# Patient Record
Sex: Female | Born: 1995 | Race: Black or African American | Hispanic: No | Marital: Single | Smoking: Never smoker
Health system: Southern US, Community
[De-identification: ages and names within clinical notes are randomized; demographics above are authoritative.]

## PROBLEM LIST (undated history)

## (undated) DIAGNOSIS — K219 Gastro-esophageal reflux disease without esophagitis: Secondary | ICD-10-CM

---

## 2017-09-17 ENCOUNTER — Other Ambulatory Visit: Payer: Self-pay

## 2017-09-17 ENCOUNTER — Emergency Department (HOSPITAL_COMMUNITY)
Admission: EM | Admit: 2017-09-17 | Discharge: 2017-09-17 | Disposition: A | Discharge status: Home / Self Care | Payer: 59 | Attending: Emergency Medicine | Admitting: Emergency Medicine

## 2017-09-17 ENCOUNTER — Encounter (HOSPITAL_COMMUNITY): Payer: Self-pay

## 2017-09-17 DIAGNOSIS — Z79899 Other long term (current) drug therapy: Secondary | ICD-10-CM | POA: Diagnosis not present

## 2017-09-17 DIAGNOSIS — R195 Other fecal abnormalities: Secondary | ICD-10-CM | POA: Diagnosis present

## 2017-09-17 DIAGNOSIS — K649 Unspecified hemorrhoids: Secondary | ICD-10-CM | POA: Insufficient documentation

## 2017-09-17 HISTORY — DX: Gastro-esophageal reflux disease without esophagitis: K21.9

## 2017-09-17 LAB — LIPASE, BLOOD: LIPASE: 34 U/L (ref 11–51)

## 2017-09-17 LAB — COMPREHENSIVE METABOLIC PANEL
ALBUMIN: 4.7 g/dL (ref 3.5–5.0)
ALT: 11 U/L — ABNORMAL LOW (ref 14–54)
ANION GAP: 11 (ref 5–15)
AST: 18 U/L (ref 15–41)
Alkaline Phosphatase: 74 U/L (ref 38–126)
BILIRUBIN TOTAL: 0.9 mg/dL (ref 0.3–1.2)
BUN: 5 mg/dL — ABNORMAL LOW (ref 6–20)
CHLORIDE: 102 mmol/L (ref 101–111)
CO2: 24 mmol/L (ref 22–32)
Calcium: 10.1 mg/dL (ref 8.9–10.3)
Creatinine, Ser: 0.67 mg/dL (ref 0.44–1.00)
GFR calc Af Amer: >60 mL/min (ref >60)
GFR calc non Af Amer: >60 mL/min (ref >60)
GLUCOSE: 89 mg/dL (ref 65–99)
POTASSIUM: 3.6 mmol/L (ref 3.5–5.1)
SODIUM: 137 mmol/L (ref 135–145)
TOTAL PROTEIN: 7.2 g/dL (ref 6.5–8.1)

## 2017-09-17 LAB — CBC
HEMATOCRIT: 41.2 % (ref 36.0–46.0)
HEMOGLOBIN: 13.9 g/dL (ref 12.0–15.0)
MCH: 30.1 pg (ref 26.0–34.0)
MCHC: 33.7 g/dL (ref 30.0–36.0)
MCV: 89.2 fL (ref 78.0–100.0)
Platelets: 263 10*3/uL (ref 150–400)
RBC: 4.62 MIL/uL (ref 3.87–5.11)
RDW: 12.5 % (ref 11.5–15.5)
WBC: 6.1 10*3/uL (ref 4.0–10.5)

## 2017-09-17 LAB — I-STAT BETA HCG BLOOD, ED (MC, WL, AP ONLY)

## 2017-09-17 MED ORDER — HYDROCORTISONE ACETATE 25 MG RE SUPP: 25.0000 mg | Freq: Two times a day (BID) | RECTAL | 0 refills | Status: AC

## 2017-09-17 MED ORDER — DOCUSATE SODIUM 100 MG PO CAPS: 100.0000 mg | ORAL_CAPSULE | Freq: Every day | ORAL | 0 refills | Status: AC

## 2017-09-17 NOTE — ED Notes (Signed)
Pt reports having bright red blood after a bowel movement. Pt reports a history of hemorrhoids.

## 2017-09-17 NOTE — ED Triage Notes (Addendum)
Pt endorses having a large diarrhea episode earlier today that had bright red blood in it. Pt has been lightheaded and has had abd discomfort since. Pt has hx of bleeding hemorrhoids.

## 2017-09-17 NOTE — Discharge Instructions (Signed)
Use the suppository twice daily. Use Colace to help soften your stools.  Make sure you stay well-hydrated. Try to avoid straining, this includes straining for bowel movement, lifting heavy objects, or anything that increases the pressure in your belly. If your symptoms persist, you may follow-up with your primary care doctor or with surgery for removal. Return to the emergency room if you develop fevers, chills, persistent pain, or any new or worsening symptoms.

## 2017-09-18 NOTE — ED Provider Notes (Signed)
[facility] Provider Note   CSN: [id number] Arrival date & time: 09/17/17  1849     History   Chief Complaint Chief Complaint  Patient presents with  . Blood In Stools    HPI Charlotte Willis is a 21 y.o. female presenting with bright red blood per rectum.  Patient states that she had a bowel movement earlier today, in which she saw bright red blood.  She reports that when she saw that, she felt anxious and like she was going to pass out.  Since then, she has had improvement of her symptoms, and no longer feels pre-syncopal.  She denies recurrence of blood in her underwear or stool.  This has happened before, and she was diagnosed with hemorrhoids at that time.  She reports an ache in her low abdomen, which feels like mild menstrual cramps.  This ache has improved throughout the day.  She has not taken anything for this.  It is intermittent, nothing makes it better or worse.  She denies fevers, chills, chest pain, shortness of breath, nausea, vomiting, abdominal pain, or urinary symptoms.  She has no other medical history, does not take medications daily.   HPI  Past Medical History:  Diagnosis Date  . GERD (gastroesophageal reflux disease)     There are no active problems to display for this patient.   History reviewed. No pertinent surgical history.  OB History    No data available       Home Medications    Prior to Admission medications   Medication Sig Start Date End Date Taking? Authorizing Provider  naproxen sodium (ALEVE) 220 MG tablet Take 880 mg daily as needed by mouth (menstrual cramps).   Yes [provider]  omeprazole (PRILOSEC) 20 MG capsule Take 20 mg daily by mouth.   Yes [provider]  docusate sodium (COLACE) 100 MG capsule Take 1 capsule (100 mg total) daily by mouth. 09/17/17   Varun Jourdan, PA-C  hydrocortisone (ANUSOL-HC) 25 MG suppository Place 1 suppository (25 mg total) 2 (two) times  daily rectally. 09/17/17   Deckard Stuber, PA-C    Family History History reviewed. No pertinent family history.  Social History Social History   Tobacco Use  . Smoking status: Never Smoker  Substance Use Topics  . Alcohol use: No    Frequency: Never  . Drug use: No     Allergies   Patient has no known allergies.   Review of Systems Review of Systems  Constitutional: Negative for chills and fever.  Respiratory: Negative for cough, chest tightness and shortness of breath.   Cardiovascular: Negative for chest pain.  Gastrointestinal: Positive for abdominal pain (Improving) and blood in stool (Resolved). Negative for constipation, diarrhea, nausea and vomiting.  Genitourinary: Negative for dysuria, frequency, hematuria and vaginal discharge.  Musculoskeletal: Negative for back pain.  Skin: Negative for wound.  Neurological: Negative for headaches.  Hematological: Does not bruise/bleed easily.  Psychiatric/Behavioral: The patient is nervous/anxious.      Physical Exam Updated Vital Signs BP 108/82   Pulse 70   Temp 98.1 F (36.7 C) (Oral)   Resp 16   Ht 5\' 8"  (1.727 m)   Wt 65.8 kg (145 lb)   LMP 09/06/2017 (Exact Date)   SpO2 100%   BMI 22.05 kg/m   Physical Exam  Constitutional: She is oriented to person, place, and time. She appears well-developed and well-nourished. No distress.  HENT:  Head: Normocephalic and atraumatic.  Eyes: EOM are  normal.  Neck: Normal range of motion.  Cardiovascular: Normal rate, regular rhythm and intact distal pulses.  Pulmonary/Chest: Effort normal and breath sounds normal. No respiratory distress. She has no wheezes. She has no rales. She exhibits no tenderness.  Abdominal: Soft. Bowel sounds are normal. She exhibits no distension and no mass. There is no tenderness. There is no rebound and no guarding.  Genitourinary: Rectal exam shows external hemorrhoid.  Genitourinary Comments: Chaperone present.  External hemorrhoids  without active bleeding noted.  Musculoskeletal: Normal range of motion.  Neurological: She is alert and oriented to person, place, and time.  Skin: Skin is warm and dry.  Psychiatric: She has a normal mood and affect.  Nursing note and vitals reviewed.    ED Treatments / Results  Labs (all labs ordered are listed, but only abnormal results are displayed) Labs Reviewed  COMPREHENSIVE METABOLIC PANEL - Abnormal; Notable for the following components:      Result Value   BUN 5 (*)    ALT 11 (*)    All other components within normal limits  LIPASE, BLOOD  CBC  I-STAT BETA HCG BLOOD, ED ([facility], [facility], [facility] ONLY)    EKG  EKG Interpretation None       Radiology No results found.  Procedures Procedures (including critical care time)  Medications Ordered in ED Medications - No data to display   Initial Impression / Assessment and Plan / ED Course  I have reviewed the triage vital signs and the nursing notes.  Pertinent labs & imaging results that were available during my care of the patient were reviewed by me and considered in my medical decision making (see chart for details).     Patient presenting for evaluation after episode of bright red blood in stool.  Patient with a history of hemorrhoids.  She has not had continuation of blood in stool since.  She has a low abdominal ache, which has improved over the course of today.  When she first of the blood, she felt very anxious and like she is going to pass out, but this has improved.  Physical exam reassuring, abdominal exam showed soft nontender abdomen.  External hemorrhoids noted on exam.  Labs reassuring, no anemia or leukocytosis.  Patient is afebrile not tachycardic.  She is not distressed.  At this time, I do not believe she needs further abdominal imaging.  Discussed findings with patient.  Discussed likely hemorrhoids as cause of symptoms.  Will treat conservatively with steroid suppository and docusate to keep stool soft.   Patient to follow-up with surgery or primary care as needed for further evaluation and management of her hemorrhoids.  At this time, patient appears safe for discharge.  Return precautions given.  Patient states she understands and agrees to plan.   Final Clinical Impressions(s) / ED Diagnoses   Final diagnoses:  Hemorrhoids, unspecified hemorrhoid type    ED Discharge Orders        Ordered    hydrocortisone (ANUSOL-HC) 25 MG suppository  2 times daily     09/17/17 2300    docusate sodium (COLACE) 100 MG capsule  Daily     09/17/17 2300       Rosamaria Donn, PA-C 09/18/17 0134    Gerhard MunchLockwood, Robert, MD 09/18/17 [phone]

## 2018-02-12 ENCOUNTER — Other Ambulatory Visit: Payer: Self-pay

## 2018-02-12 ENCOUNTER — Encounter (HOSPITAL_COMMUNITY): Payer: Self-pay

## 2018-02-12 ENCOUNTER — Emergency Department (HOSPITAL_COMMUNITY)
Admission: EM | Admit: 2018-02-12 | Discharge: 2018-02-13 | Disposition: A | Discharge status: Home / Self Care | Payer: 59 | Attending: Emergency Medicine | Admitting: Emergency Medicine

## 2018-02-12 DIAGNOSIS — R1084 Generalized abdominal pain: Secondary | ICD-10-CM | POA: Diagnosis not present

## 2018-02-12 DIAGNOSIS — R109 Unspecified abdominal pain: Secondary | ICD-10-CM | POA: Diagnosis present

## 2018-02-12 LAB — COMPREHENSIVE METABOLIC PANEL
ALBUMIN: 4.5 g/dL (ref 3.5–5.0)
ALT: 9 U/L — ABNORMAL LOW (ref 14–54)
ANION GAP: 8 (ref 5–15)
AST: 13 U/L — AB (ref 15–41)
Alkaline Phosphatase: 56 U/L (ref 38–126)
BUN: 9 mg/dL (ref 6–20)
CHLORIDE: 105 mmol/L (ref 101–111)
CO2: 28 mmol/L (ref 22–32)
Calcium: 9.6 mg/dL (ref 8.9–10.3)
Creatinine, Ser: 0.81 mg/dL (ref 0.44–1.00)
GFR calc Af Amer: >60 mL/min (ref >60)
GFR calc non Af Amer: >60 mL/min (ref >60)
GLUCOSE: 96 mg/dL (ref 65–99)
POTASSIUM: 3.6 mmol/L (ref 3.5–5.1)
SODIUM: 141 mmol/L (ref 135–145)
Total Bilirubin: 0.6 mg/dL (ref 0.3–1.2)
Total Protein: 7.6 g/dL (ref 6.5–8.1)

## 2018-02-12 LAB — CBC
HEMATOCRIT: 42 % (ref 36.0–46.0)
HEMOGLOBIN: 13.8 g/dL (ref 12.0–15.0)
MCH: 30.2 pg (ref 26.0–34.0)
MCHC: 32.9 g/dL (ref 30.0–36.0)
MCV: 91.9 fL (ref 78.0–100.0)
Platelets: 238 10*3/uL (ref 150–400)
RBC: 4.57 MIL/uL (ref 3.87–5.11)
RDW: 12.3 % (ref 11.5–15.5)
WBC: 4.6 10*3/uL (ref 4.0–10.5)

## 2018-02-12 LAB — URINALYSIS, ROUTINE W REFLEX MICROSCOPIC
Bilirubin Urine: NEGATIVE
Glucose, UA: NEGATIVE mg/dL
Ketones, ur: NEGATIVE mg/dL
Leukocytes, UA: NEGATIVE
Nitrite: NEGATIVE
PROTEIN: NEGATIVE mg/dL
SPECIFIC GRAVITY, URINE: 1.019 (ref 1.005–1.030)
pH: 5 (ref 5.0–8.0)

## 2018-02-12 LAB — I-STAT BETA HCG BLOOD, ED (MC, WL, AP ONLY)

## 2018-02-12 LAB — LIPASE, BLOOD: LIPASE: 40 U/L (ref 11–51)

## 2018-02-12 NOTE — ED Provider Notes (Signed)
[facility]-EMERGENCY DEPT Provider Note   CSN: [id number] Arrival date & time: 02/12/18  2143    History   Chief Complaint Chief Complaint  Patient presents with  . Abdominal Pain    HPI Charlotte Willis is a 22 y.o. female.   22 year old female with a history of reflux presents to the emergency department for abdominal pain.  She has had intermittent abdominal pain which has been waxing and waning for the past 2 weeks.  She reports no associated bloating sensation in her abdomen with most discomfort in her epigastrium.  Symptoms worsened after eating [employer] a few days ago.  She has not had any fevers, vomiting, diarrhea, melena, hematochezia, dysuria.  No history of abdominal surgeries.  She did try taking Imodium prior to arrival without relief.  She also used some Metamucil from a friend.  Patient notes little improvement after taking Naproxen today.  The history is provided by the patient. No language interpreter was used.  Abdominal Pain      Past Medical History:  Diagnosis Date  . GERD (gastroesophageal reflux disease)     There are no active problems to display for this patient.   History reviewed. No pertinent surgical history.   OB History   None      Home Medications    Prior to Admission medications   Medication Sig Start Date End Date Taking? Authorizing Provider  docusate sodium (COLACE) 100 MG capsule Take 1 capsule (100 mg total) daily by mouth. 09/17/17   Caccavale, Sophia, PA-C  hydrocortisone (ANUSOL-HC) 25 MG suppository Place 1 suppository (25 mg total) 2 (two) times daily rectally. 09/17/17   Caccavale, Sophia, PA-C  naproxen sodium (ALEVE) 220 MG tablet Take 880 mg daily as needed by mouth (menstrual cramps).    [provider]  omeprazole (PRILOSEC) 20 MG capsule Take 20 mg daily by mouth.    [provider]  polyethylene glycol powder (GLYCOLAX/MIRALAX) powder Take 17 g by mouth daily. Until daily soft  stools  OTC 02/13/18   Antony Madura, PA-C    Family History History reviewed. No pertinent family history.  Social History Social History   Tobacco Use  . Smoking status: Never Smoker  . Smokeless tobacco: Never Used  Substance Use Topics  . Alcohol use: No    Frequency: Never  . Drug use: No     Allergies   Patient has no known allergies.   Review of Systems Review of Systems  Gastrointestinal: Positive for abdominal pain.  Ten systems reviewed and are negative for acute change, except as noted in the HPI.    Physical Exam Updated Vital Signs BP 111/75 (BP Location: Left Arm)   Pulse 62   Temp 98 F (36.7 C) (Oral)   Resp 16   Ht 5\' 8"  (1.727 m)   Wt 66.2 kg (146 lb)   SpO2 100%   BMI 22.20 kg/m   Physical Exam  Constitutional: She is oriented to person, place, and time. She appears well-developed and well-nourished. No distress.  Nontoxic appearing and in NAD  HENT:  Head: Normocephalic and atraumatic.  Eyes: Conjunctivae and EOM are normal. No scleral icterus.  Neck: Normal range of motion.  Cardiovascular: Normal rate, regular rhythm and intact distal pulses.  Pulmonary/Chest: Effort normal. No stridor. No respiratory distress. She has no wheezes. She has no rales.  Lungs CTAB. Respirations even and unlabored.  Abdominal: Soft. She exhibits no mass. There is tenderness (generalized). There is no guarding.  Soft  without palpable masses or peritoneal signs. No focal TTP at McBurney's point. Negative Murphy's sign.  Musculoskeletal: Normal range of motion.  Neurological: She is alert and oriented to person, place, and time. She exhibits normal muscle tone. Coordination normal.  Skin: Skin is warm and dry. No rash noted. She is not diaphoretic. No erythema. No pallor.  Psychiatric: She has a normal mood and affect. Her behavior is normal.  Nursing note and vitals reviewed.    ED Treatments / Results  Labs (all labs ordered are listed, but only abnormal  results are displayed) Labs Reviewed  COMPREHENSIVE METABOLIC PANEL - Abnormal; Notable for the following components:      Result Value   AST 13 (*)    ALT 9 (*)    All other components within normal limits  URINALYSIS, ROUTINE W REFLEX MICROSCOPIC - Abnormal; Notable for the following components:   APPearance HAZY (*)    Hgb urine dipstick LARGE (*)    Bacteria, UA RARE (*)    Squamous Epithelial / LPF 0-5 (*)    All other components within normal limits  LIPASE, BLOOD  CBC  I-STAT BETA HCG BLOOD, ED ([facility], [facility], [facility] ONLY)    EKG None  Radiology US Abdomen Complete  Result Date: 02/13/2018 CLINICAL DATA:  Abdominal pain for 2 weeks. EXAM: ABDOMEN ULTRASOUND COMPLETE COMPARISON:  None. FINDINGS: Gallbladder: No gallstones or wall thickening visualized. No sonographic Murphy sign noted by sonographer. Common bile duct: Diameter: 2.2 mm, normal Liver: No focal lesion identified. Within normal limits in parenchymal echogenicity. Portal vein is patent on color Doppler imaging with normal direction of blood flow towards the liver. IVC: No abnormality visualized. Pancreas: Visualized portion unremarkable. Spleen: Size and appearance within normal limits. Right Kidney: Length: 9.4 cm. Echogenicity within normal limits. No mass or hydronephrosis visualized. Left Kidney: Length: 11 cm. Echogenicity within normal limits. No mass or hydronephrosis visualized. Abdominal aorta: No aneurysm visualized. Other findings: None. IMPRESSION: Normal examination.  No acute process identified. Electronically Signed   By: Burman Nieves M.D.   On: 02/13/2018 01:10    Procedures Procedures (including critical care time)  Medications Ordered in ED Medications - No data to display   Initial Impression / Assessment and Plan / ED Course  I have reviewed the triage vital signs and the nursing notes.  Pertinent labs & imaging results that were available during my care of the patient were reviewed by me and  considered in my medical decision making (see chart for details).     21 year old female presents to the emergency department for generalized abdominal discomfort as well as bloating.  She has not experienced vomiting, diarrhea, urinary symptoms.  She has no focal abdominal tenderness on exam.  No peritoneal signs.  Laboratory workup reassuring.  An ultrasound was obtained which shows no evidence of gallstones or other acute process in the abdomen.  Exam is stable on repeat assessment.  Question whether symptoms may be secondary to constipation.  Low suspicion for emergent cause of symptoms.  Will plan for increasing daily fiber and starting MiraLAX.  Patient advised to follow-up with a primary care doctor regarding her visit today.  Return precautions discussed and provided.  Patient discharged in stable condition with no unaddressed concerns.   Final Clinical Impressions(s) / ED Diagnoses   Final diagnoses:  Abdominal pain, unspecified abdominal location    ED Discharge Orders        Ordered    polyethylene glycol powder (GLYCOLAX/MIRALAX) powder  Daily  02/13/18 0137       Antony MaduraHumes, Carnell Beavers, PA-C 02/13/18 0232    Palumbo, April, MD 02/13/18 [id number]

## 2018-02-12 NOTE — ED Triage Notes (Signed)
States abdominal pain for 2 weeks and pain in epigastric area no fever noted no dysuria voiced.

## 2018-02-13 ENCOUNTER — Emergency Department (HOSPITAL_COMMUNITY): Payer: 59

## 2018-02-13 MED ORDER — POLYETHYLENE GLYCOL 3350 17 GM/SCOOP PO POWD: 17.0000 g | Freq: Every day | ORAL | 0 refills | Status: AC

## 2018-02-13 NOTE — Discharge Instructions (Signed)
Your workup in the emergency department today was reassuring and did not reveal a concerning cause of your symptoms.  We recommend use of daily MiraLAX.  Continue your daily omeprazole.  Follow-up with a primary care doctor to ensure resolution of symptoms.

## 2018-03-05 ENCOUNTER — Encounter (HOSPITAL_COMMUNITY): Payer: Self-pay | Admitting: Emergency Medicine

## 2018-03-05 ENCOUNTER — Ambulatory Visit (HOSPITAL_COMMUNITY)
Admission: EM | Admit: 2018-03-05 | Discharge: 2018-03-05 | Disposition: A | Discharge status: Home / Self Care | Payer: 59 | Attending: Family Medicine | Admitting: Family Medicine

## 2018-03-05 DIAGNOSIS — R1010 Upper abdominal pain, unspecified: Secondary | ICD-10-CM

## 2018-03-05 DIAGNOSIS — K529 Noninfective gastroenteritis and colitis, unspecified: Secondary | ICD-10-CM

## 2018-03-05 MED ORDER — RANITIDINE HCL 300 MG PO TABS: 300.0000 mg | ORAL_TABLET | Freq: Every day | ORAL | 0 refills | Status: AC

## 2018-03-05 MED ORDER — ONDANSETRON 4 MG PO TBDP
ORAL_TABLET | ORAL | Status: AC
  Filled 2018-03-05: qty 1

## 2018-03-05 MED ORDER — ONDANSETRON 8 MG PO TBDP: 8.0000 mg | ORAL_TABLET | Freq: Three times a day (TID) | ORAL | 0 refills | Status: AC | PRN

## 2018-03-05 MED ORDER — ONDANSETRON 4 MG PO TBDP
4.0000 mg | ORAL_TABLET | Freq: Once | ORAL | Status: AC
  Administered 2018-03-05: 4 mg via ORAL

## 2018-03-05 NOTE — ED Triage Notes (Signed)
Pt sts upper abd pain with hx of GERD

## 2018-03-05 NOTE — Discharge Instructions (Addendum)
Clear liquids or broth for the next 24 hours.  Avoid junk food in the future.  Your digestive tract is rebelling from poor food selection.

## 2018-03-05 NOTE — ED Provider Notes (Signed)
[facility]   [id number] 03/05/18 Arrival Time: 1913   SUBJECTIVE:  Charlotte Willis is a 22 y.o. female who presents to the urgent care with complaint of upper abd pain with hx of GERD.  Patient has had bloating for three days.  No vomiting.  Some nausea.  Passing gas.  No diarrhea.   Past Medical History:  Diagnosis Date  . GERD (gastroesophageal reflux disease)    History reviewed. No pertinent family history. Social History   Socioeconomic History  . Marital status: Single    Spouse name: Not on file  . Number of children: Not on file  . Years of education: Not on file  . Highest education level: Not on file  Occupational History  . Not on file  Social Needs  . Financial resource strain: Not on file  . Food insecurity:    Worry: Not on file    Inability: Not on file  . Transportation needs:    Medical: Not on file    Non-medical: Not on file  Tobacco Use  . Smoking status: Never Smoker  . Smokeless tobacco: Never Used  Substance and Sexual Activity  . Alcohol use: No    Frequency: Never  . Drug use: No  . Sexual activity: Not on file  Lifestyle  . Physical activity:    Days per week: Not on file    Minutes per session: Not on file  . Stress: Not on file  Relationships  . Social connections:    Talks on phone: Not on file    Gets together: Not on file    Attends religious service: Not on file    Active member of club or organization: Not on file    Attends meetings of clubs or organizations: Not on file    Relationship status: Not on file  . Intimate partner violence:    Fear of current or ex partner: Not on file    Emotionally abused: Not on file    Physically abused: Not on file    Forced sexual activity: Not on file  Other Topics Concern  . Not on file  Social History Narrative  . Not on file   No outpatient medications have been marked as taking for the 03/05/18 encounter [facility] Encounter).   No Known Allergies    ROS: As per  HPI, remainder of ROS negative.   OBJECTIVE:   Vitals:   03/05/18 1945  BP: 121/82  Pulse: 85  Resp: 18  Temp: 98.2 F (36.8 C)  TempSrc: Oral  SpO2: 95%     General appearance: alert; no distress Eyes: PERRL; EOMI; conjunctiva normal HENT: normocephalic; atraumatic; oral mucosa normal Neck: supple Lungs: clear to auscultation bilaterally Heart: regular rate and rhythm Abdomen: soft, non-tender; bowel sounds normal; no masses or organomegaly; no guarding or rebound tenderness Back: no CVA tenderness Extremities: no cyanosis or edema; symmetrical with no gross deformities Skin: warm and dry Neurologic: normal gait; grossly normal Psychological: alert and cooperative; normal mood and affect      Labs:  Results for orders placed or performed during the hospital encounter of 02/12/18  Lipase, blood  Result Value Ref Range   Lipase 40 11 - 51 U/L  Comprehensive metabolic panel  Result Value Ref Range   Sodium 141 135 - 145 mmol/L   Potassium 3.6 3.5 - 5.1 mmol/L   Chloride 105 101 - 111 mmol/L   CO2 28 22 - 32 mmol/L   Glucose, Bld 96 65 - 99  mg/dL   BUN 9 6 - 20 mg/dL   Creatinine, Ser 0.86 0.44 - 1.00 mg/dL   Calcium 9.6 8.9 - 57.8 mg/dL   Total Protein 7.6 6.5 - 8.1 g/dL   Albumin 4.5 3.5 - 5.0 g/dL   AST 13 (L) 15 - 41 U/L   ALT 9 (L) 14 - 54 U/L   Alkaline Phosphatase 56 38 - 126 U/L   Total Bilirubin 0.6 0.3 - 1.2 mg/dL   GFR calc non Af Amer >60 >60 mL/min   GFR calc Af Amer >60 >60 mL/min   Anion gap 8 5 - 15  CBC  Result Value Ref Range   WBC 4.6 4.0 - 10.5 K/uL   RBC 4.57 3.87 - 5.11 MIL/uL   Hemoglobin 13.8 12.0 - 15.0 g/dL   HCT 46.9 62.9 - 52.8 %   MCV 91.9 78.0 - 100.0 fL   MCH 30.2 26.0 - 34.0 pg   MCHC 32.9 30.0 - 36.0 g/dL   RDW 41.3 24.4 - 01.0 %   Platelets 238 150 - 400 K/uL  Urinalysis, Routine w reflex microscopic  Result Value Ref Range   Color, Urine YELLOW YELLOW   APPearance HAZY (A) CLEAR   Specific Gravity, Urine 1.019  1.005 - 1.030   pH 5.0 5.0 - 8.0   Glucose, UA NEGATIVE NEGATIVE mg/dL   Hgb urine dipstick LARGE (A) NEGATIVE   Bilirubin Urine NEGATIVE NEGATIVE   Ketones, ur NEGATIVE NEGATIVE mg/dL   Protein, ur NEGATIVE NEGATIVE mg/dL   Nitrite NEGATIVE NEGATIVE   Leukocytes, UA NEGATIVE NEGATIVE   RBC / HPF 0-5 0 - 5 RBC/hpf   WBC, UA 6-30 0 - 5 WBC/hpf   Bacteria, UA RARE (A) NONE SEEN   Squamous Epithelial / LPF 0-5 (A) NONE SEEN   Mucus PRESENT   I-Stat beta hCG blood, ED  Result Value Ref Range   I-stat hCG, quantitative <5.0 <5 mIU/mL   Comment 3            Labs Reviewed - No data to display  No results found.     ASSESSMENT & PLAN:  1. Noninfectious gastroenteritis, unspecified type   Clear liquids or broth for the next 24 hours.  Avoid junk food in the future.  Your digestive tract is rebelling from poor food selection.  Meds ordered this encounter  Medications  . ondansetron (ZOFRAN-ODT) disintegrating tablet 4 mg  . ranitidine (ZANTAC) 300 MG tablet    Sig: Take 1 tablet (300 mg total) by mouth at bedtime.    Dispense:  10 tablet    Refill:  0  . ondansetron (ZOFRAN-ODT) 8 MG disintegrating tablet    Sig: Take 1 tablet (8 mg total) by mouth every 8 (eight) hours as needed for nausea.    Dispense:  12 tablet    Refill:  0    Reviewed expectations re: course of current medical issues. Questions answered. Outlined signs and symptoms indicating need for more acute intervention. Patient verbalized understanding. After Visit Summary given.    Procedures:      Elvina Sidle, MD 03/05/18 2001

## 2018-09-21 ENCOUNTER — Encounter (HOSPITAL_COMMUNITY): Payer: Self-pay

## 2018-09-21 ENCOUNTER — Other Ambulatory Visit: Payer: Self-pay

## 2018-09-21 ENCOUNTER — Emergency Department (HOSPITAL_COMMUNITY)
Admission: EM | Admit: 2018-09-21 | Discharge: 2018-09-21 | Disposition: A | Discharge status: Home / Self Care | Payer: Managed Care, Other (non HMO) | Attending: Emergency Medicine | Admitting: Emergency Medicine

## 2018-09-21 ENCOUNTER — Emergency Department (HOSPITAL_COMMUNITY): Payer: Managed Care, Other (non HMO)

## 2018-09-21 DIAGNOSIS — Z79899 Other long term (current) drug therapy: Secondary | ICD-10-CM | POA: Insufficient documentation

## 2018-09-21 DIAGNOSIS — R05 Cough: Secondary | ICD-10-CM | POA: Insufficient documentation

## 2018-09-21 DIAGNOSIS — R0981 Nasal congestion: Secondary | ICD-10-CM | POA: Insufficient documentation

## 2018-09-21 DIAGNOSIS — R059 Cough, unspecified: Secondary | ICD-10-CM

## 2018-09-21 MED ORDER — BENZONATATE 100 MG PO CAPS: 100.0000 mg | ORAL_CAPSULE | Freq: Three times a day (TID) | ORAL | 0 refills | Status: AC

## 2018-09-21 MED ORDER — PSEUDOEPHEDRINE-GUAIFENESIN ER 60-600 MG PO TB12: 1.0000 | ORAL_TABLET | Freq: Two times a day (BID) | ORAL | 0 refills | Status: AC

## 2018-09-21 NOTE — ED Triage Notes (Signed)
Pt reports cough since last Sunday. She reports hemoptysis today.

## 2018-09-21 NOTE — ED Provider Notes (Signed)
[facility]-EMERGENCY DEPT Provider Note   CSN: [id number] Arrival date & time: 09/21/18  0240     History   Chief Complaint Chief Complaint  Patient presents with  . Cough    HPI Charlotte Willis is a 22 y.o. female.  The history is provided by the patient and medical records.  Cough      22 y.o. F with hx of GERD, presenting to the ED for cough.  States she has had a cough for about 3 days now.  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  Reports yesterday at practice she developed a nosebleed and then began coughing up blood.  Reports she does feel some "congestion" in her chest and in her nose.  She denies any fevers or chills.  No nausea or vomiting.  She denies any chest pain or shortness of breath.  Past Medical History:  Diagnosis Date  . GERD (gastroesophageal reflux disease)     There are no active problems to display for this patient.   History reviewed. No pertinent surgical history.   OB History   None      Home Medications    Prior to Admission medications   Medication Sig Start Date End Date Taking? Authorizing Provider  docusate sodium (COLACE) 100 MG capsule Take 1 capsule (100 mg total) daily by mouth. 09/17/17   Caccavale, Sophia, PA-C  hydrocortisone (ANUSOL-HC) 25 MG suppository Place 1 suppository (25 mg total) 2 (two) times daily rectally. 09/17/17   Caccavale, Sophia, PA-C  naproxen sodium (ALEVE) 220 MG tablet Take 880 mg daily as needed by mouth (menstrual cramps).    [provider]  ondansetron (ZOFRAN-ODT) 8 MG disintegrating tablet Take 1 tablet (8 mg total) by mouth every 8 (eight) hours as needed for nausea. 03/05/18   Elvina Sidle, MD  polyethylene glycol powder (GLYCOLAX/MIRALAX) powder Take 17 g by mouth daily. Until daily soft stools  OTC 02/13/18   Antony Madura, PA-C  ranitidine (ZANTAC) 300 MG tablet Take 1 tablet (300 mg total) by mouth at bedtime. 03/05/18    Elvina Sidle, MD    Family History History reviewed. No pertinent family history.  Social History Social History   Tobacco Use  . Smoking status: Never Smoker  . Smokeless tobacco: Never Used  Substance Use Topics  . Alcohol use: No    Frequency: Never  . Drug use: No     Allergies   Patient has no known allergies.   Review of Systems Review of Systems  Respiratory: Positive for cough.   All other systems reviewed and are negative.    Physical Exam Updated Vital Signs BP 111/83 (BP Location: Left Arm)   Pulse 67   Temp 98.1 F (36.7 C) (Oral)   Resp 17   SpO2 98%   Physical Exam  Constitutional: She is oriented to person, place, and time. She appears well-developed and well-nourished.  HENT:  Head: Normocephalic and atraumatic.  Right Ear: Tympanic membrane and ear canal normal.  Left Ear: Tympanic membrane and ear canal normal.  Nose: Mucosal edema present.  Mouth/Throat: Uvula is midline, oropharynx is clear and moist and mucous membranes are normal.  Nasal congestion, polyp left nostril, no active bleeding  Eyes: Pupils are equal, round, and reactive to light. Conjunctivae and EOM are normal.  Neck: Normal range of motion.  Cardiovascular: Normal rate, regular rhythm and normal heart sounds.  Pulmonary/Chest: Effort normal and breath sounds normal. No stridor. No  respiratory distress. She has no wheezes. She has no rhonchi.  Abdominal: Soft. Bowel sounds are normal. There is no tenderness. There is no rebound.  Musculoskeletal: Normal range of motion.  Neurological: She is alert and oriented to person, place, and time.  Skin: Skin is warm and dry.  Psychiatric: She has a normal mood and affect.  Nursing note and vitals reviewed.    ED Treatments / Results  Labs (all labs ordered are listed, but only abnormal results are displayed) Labs Reviewed - No data to display  EKG None  Radiology No results found.  Procedures Procedures (including  critical care time)  Medications Ordered in ED Medications - No data to display   Initial Impression / Assessment and Plan / ED Course  I have reviewed the triage vital signs and the nursing notes.  Pertinent labs & imaging results that were available during my care of the patient were reviewed by me and considered in my medical decision making (see chart for details).  22 y.o. F here with cough for 3 days.  Reports hemoptysis yesterday in the context of nosebleed.  She is afebrile, non-toxic in appearance here.  Has some nasal congestion without active epistaxis.  Lungs are clear without wheezes or rhonchi.  She has no tachycardia or hypoxia, no complaint of chest pain or SOB.  Doubt PE. CXR obtained and is negative.  Discussed symptomatic care with decongestants and cough medication.  School note provided.  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  She will return here for any new/acute changes.  Final Clinical Impressions(s) / ED Diagnoses   Final diagnoses:  Cough    ED Discharge Orders         Ordered    benzonatate (TESSALON) 100 MG capsule  Every 8 hours     09/21/18 0400    pseudoephedrine-guaifenesin (MUCINEX D) 60-600 MG 12 hr tablet  Every 12 hours     09/21/18 0400           Garlon HatchetSanders, Ellington Cornia M, PA-C 09/21/18 0406    Gilda CreasePollina, Christopher J, MD 09/21/18 [phone]

## 2018-09-21 NOTE — Discharge Instructions (Signed)
Take the prescribed medication as directed.  Make sure to rest and drink fluids. Follow-up with your primary care doctor or student health center. Return to the ED for new or worsening symptoms.

## 2018-12-26 ENCOUNTER — Other Ambulatory Visit: Payer: Self-pay

## 2018-12-26 ENCOUNTER — Ambulatory Visit (HOSPITAL_COMMUNITY)
Admission: EM | Admit: 2018-12-26 | Discharge: 2018-12-26 | Disposition: A | Discharge status: Home / Self Care | Payer: Managed Care, Other (non HMO) | Attending: Internal Medicine | Admitting: Internal Medicine

## 2018-12-26 ENCOUNTER — Encounter (HOSPITAL_COMMUNITY): Payer: Self-pay | Admitting: Emergency Medicine

## 2018-12-26 DIAGNOSIS — G4452 New daily persistent headache (NDPH): Secondary | ICD-10-CM | POA: Diagnosis not present

## 2018-12-26 DIAGNOSIS — M542 Cervicalgia: Secondary | ICD-10-CM

## 2018-12-26 MED ORDER — SUMATRIPTAN SUCCINATE 100 MG PO TABS: 100.0000 mg | ORAL_TABLET | ORAL | 0 refills | Status: AC | PRN

## 2018-12-26 MED ORDER — SUMATRIPTAN SUCCINATE 6 MG/0.5ML ~~LOC~~ SOLN
6.0000 mg | Freq: Once | SUBCUTANEOUS | Status: AC
  Administered 2018-12-26: 6 mg via SUBCUTANEOUS

## 2018-12-26 MED ORDER — SUMATRIPTAN SUCCINATE 6 MG/0.5ML ~~LOC~~ SOLN
SUBCUTANEOUS | Status: AC
  Filled 2018-12-26: qty 0.5

## 2018-12-26 NOTE — Discharge Instructions (Signed)
A dose of sumatriptan (a migraine medicine) helped your headache in clinic today.  Prescription for sumatriptan tablets was sent to the pharmacy; can take up to 2 tablets in 24 hours for bad headache.  Ice for 5-10 minutes several times daily can be helpful in decreasing headache/neck pain.  Physical therapy for core strengthening can also help with relieving daily headaches/neck pain.  Followup with your primary care provider to discuss whether a daily preventive medicine might be a good strategy if your headaches persist.
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
## 2018-12-26 NOTE — ED Provider Notes (Signed)
[facility]    CSN: [id number] Arrival date & time: 12/26/18  1640     History   Chief Complaint Chief Complaint  Patient presents with  . Headache  . Neck Pain    HPI Charlotte Willis is a 23 y.o. female.   She presents today with neck pain, right mid/posterior neck, for the last month or so.  She has also had some trouble with right-sided frontal headache during that time.  She was seen recently at student health, and prescribed Aleve and Flexeril, which have not been that helpful.  She tends to notice neck pain later in the day or when she is laying down.  She does sometimes spend time on her phone while she is in bed.  Works at [employer], notices pain in her neck on the job. No weakness or clumsiness of the arms or legs, was able to walk into the urgent care independently.  No change in vision.  No vomiting but does have sound and light sensitivity at times. She has had episodic headaches in the past. Family history notable for headaches in the mother.    HPI  Past Medical History:  Diagnosis Date  . GERD (gastroesophageal reflux disease)     History reviewed. No pertinent surgical history.     Home Medications    Prior to Admission medications   Medication Sig Start Date End Date Taking? Authorizing Provider  cyclobenzaprine (FLEXERIL) 10 MG tablet Take 10 mg by mouth 3 (three) times daily as needed for muscle spasms.   Yes [provider]  naproxen sodium (ALEVE) 220 MG tablet Take 220 mg by mouth.   Yes [provider]  benzonatate (TESSALON) 100 MG capsule Take 1 capsule (100 mg total) by mouth every 8 (eight) hours. 09/21/18   Garlon Hatchet, PA-C  docusate sodium (COLACE) 100 MG capsule Take 1 capsule (100 mg total) daily by mouth. 09/17/17   Caccavale, Sophia, PA-C  hydrocortisone (ANUSOL-HC) 25 MG suppository Place 1 suppository (25 mg total) 2 (two) times daily rectally. 09/17/17   Caccavale, Sophia, PA-C  naproxen sodium (ALEVE)  220 MG tablet Take 880 mg daily as needed by mouth (menstrual cramps).    [provider]  ondansetron (ZOFRAN-ODT) 8 MG disintegrating tablet Take 1 tablet (8 mg total) by mouth every 8 (eight) hours as needed for nausea. 03/05/18   Elvina Sidle, MD  polyethylene glycol powder (GLYCOLAX/MIRALAX) powder Take 17 g by mouth daily. Until daily soft stools  OTC 02/13/18   Antony Madura, PA-C  pseudoephedrine-guaifenesin [facility] D) 60-600 MG 12 hr tablet Take 1 tablet by mouth every 12 (twelve) hours. 09/21/18   Garlon Hatchet, PA-C  ranitidine (ZANTAC) 300 MG tablet Take 1 tablet (300 mg total) by mouth at bedtime. 03/05/18   Elvina Sidle, MD  SUMAtriptan (IMITREX) 100 MG tablet Take 1 tablet (100 mg total) by mouth every 2 (two) hours as needed for migraine. Maximum 2 tablets for headache, in 24 hours 12/26/18   Isa Rankin, MD    Family History Family History  Problem Relation Age of Onset  . Hypertension Father     Social History Social History   Tobacco Use  . Smoking status: Never Smoker  . Smokeless tobacco: Never Used  Substance Use Topics  . Alcohol use: No    Frequency: Never  . Drug use: No     Allergies   Patient has no known allergies.   Review of Systems Review of Systems  All  other systems reviewed and are negative.    Physical Exam Triage Vital Signs ED Triage Vitals  Enc Vitals Group     BP 12/26/18 1831 106/77     Pulse Rate 12/26/18 1831 83     Resp 12/26/18 1831 18     Temp 12/26/18 1831 98.1 F (36.7 C)     Temp Source 12/26/18 1831 Temporal     SpO2 12/26/18 1831 100 %     Weight --      Height --      Pain Score 12/26/18 1827 8     Pain Loc --    Updated Vital Signs BP 106/77 (BP Location: Right Arm)   Pulse 83   Temp 98.1 F (36.7 C) (Temporal)   Resp 18   LMP 12/22/2018   SpO2 100%  Physical Exam Vitals signs and nursing note reviewed.  Constitutional:      General: She is not in acute distress.    Comments:  Alert, nicely groomed  HENT:     Head: Atraumatic.     Comments: Bilateral TMs are translucent, no erythema No significant nasal congestion bilaterally Throat is pink Eyes:     Comments: Conjugate gaze, no eye redness/drainage  Neck:     Musculoskeletal: Neck supple.  Cardiovascular:     Rate and Rhythm: Normal rate.  Pulmonary:     Effort: No respiratory distress.  Abdominal:     General: There is no distension.  Musculoskeletal: Normal range of motion.     Comments: No leg swelling  Skin:    General: Skin is warm and dry.     Comments: No cyanosis  Neurological:     Mental Status: She is alert and oriented to person, place, and time.     Comments: Face is symmetric, speech is clear/coherent Patient was able to walk into the urgent care independently and climb onto the exam table without difficulty      UC Treatments / Results   Procedures Procedures (including critical care time)  Medications Ordered in UC Medications  SUMAtriptan (IMITREX) injection 6 mg (6 mg Subcutaneous Given 12/26/18 1905)   Patient reports substantial improvement in pain after sumatriptan injection.  Final Clinical Impressions(s) / UC Diagnoses   Final diagnoses:  New persistent daily headache  Neck pain     Discharge Instructions     A dose of sumatriptan (a migraine medicine) helped your headache in clinic today.  Prescription for sumatriptan tablets was sent to the pharmacy; can take up to 2 tablets in 24 hours for bad headache.  Ice for 5-10 minutes several times daily can be helpful in decreasing headache/neck pain.  Physical therapy for core strengthening can also help with relieving daily headaches/neck pain.  Followup with your primary care provider to discuss whether a daily preventive medicine might be a good strategy if your headaches persist.      ED Prescriptions    Medication Sig Dispense Auth. Provider   SUMAtriptan (IMITREX) 100 MG tablet Take 1 tablet (100 mg total) by  mouth every 2 (two) hours as needed for migraine. Maximum 2 tablets for headache, in 24 hours 10 tablet Isa Rankin, MD        Isa Rankin, MD 12/31/18 [phone]

## 2019-04-07 IMAGING — CR DG CHEST 2V
2 series · 2 of 2 positions shown · non-contrast
Comparison: None.

CLINICAL DATA: Acute onset of productive cough and chest
congestion. Tiredness.

EXAM:
CHEST - 2 VIEW

[w chest pa]
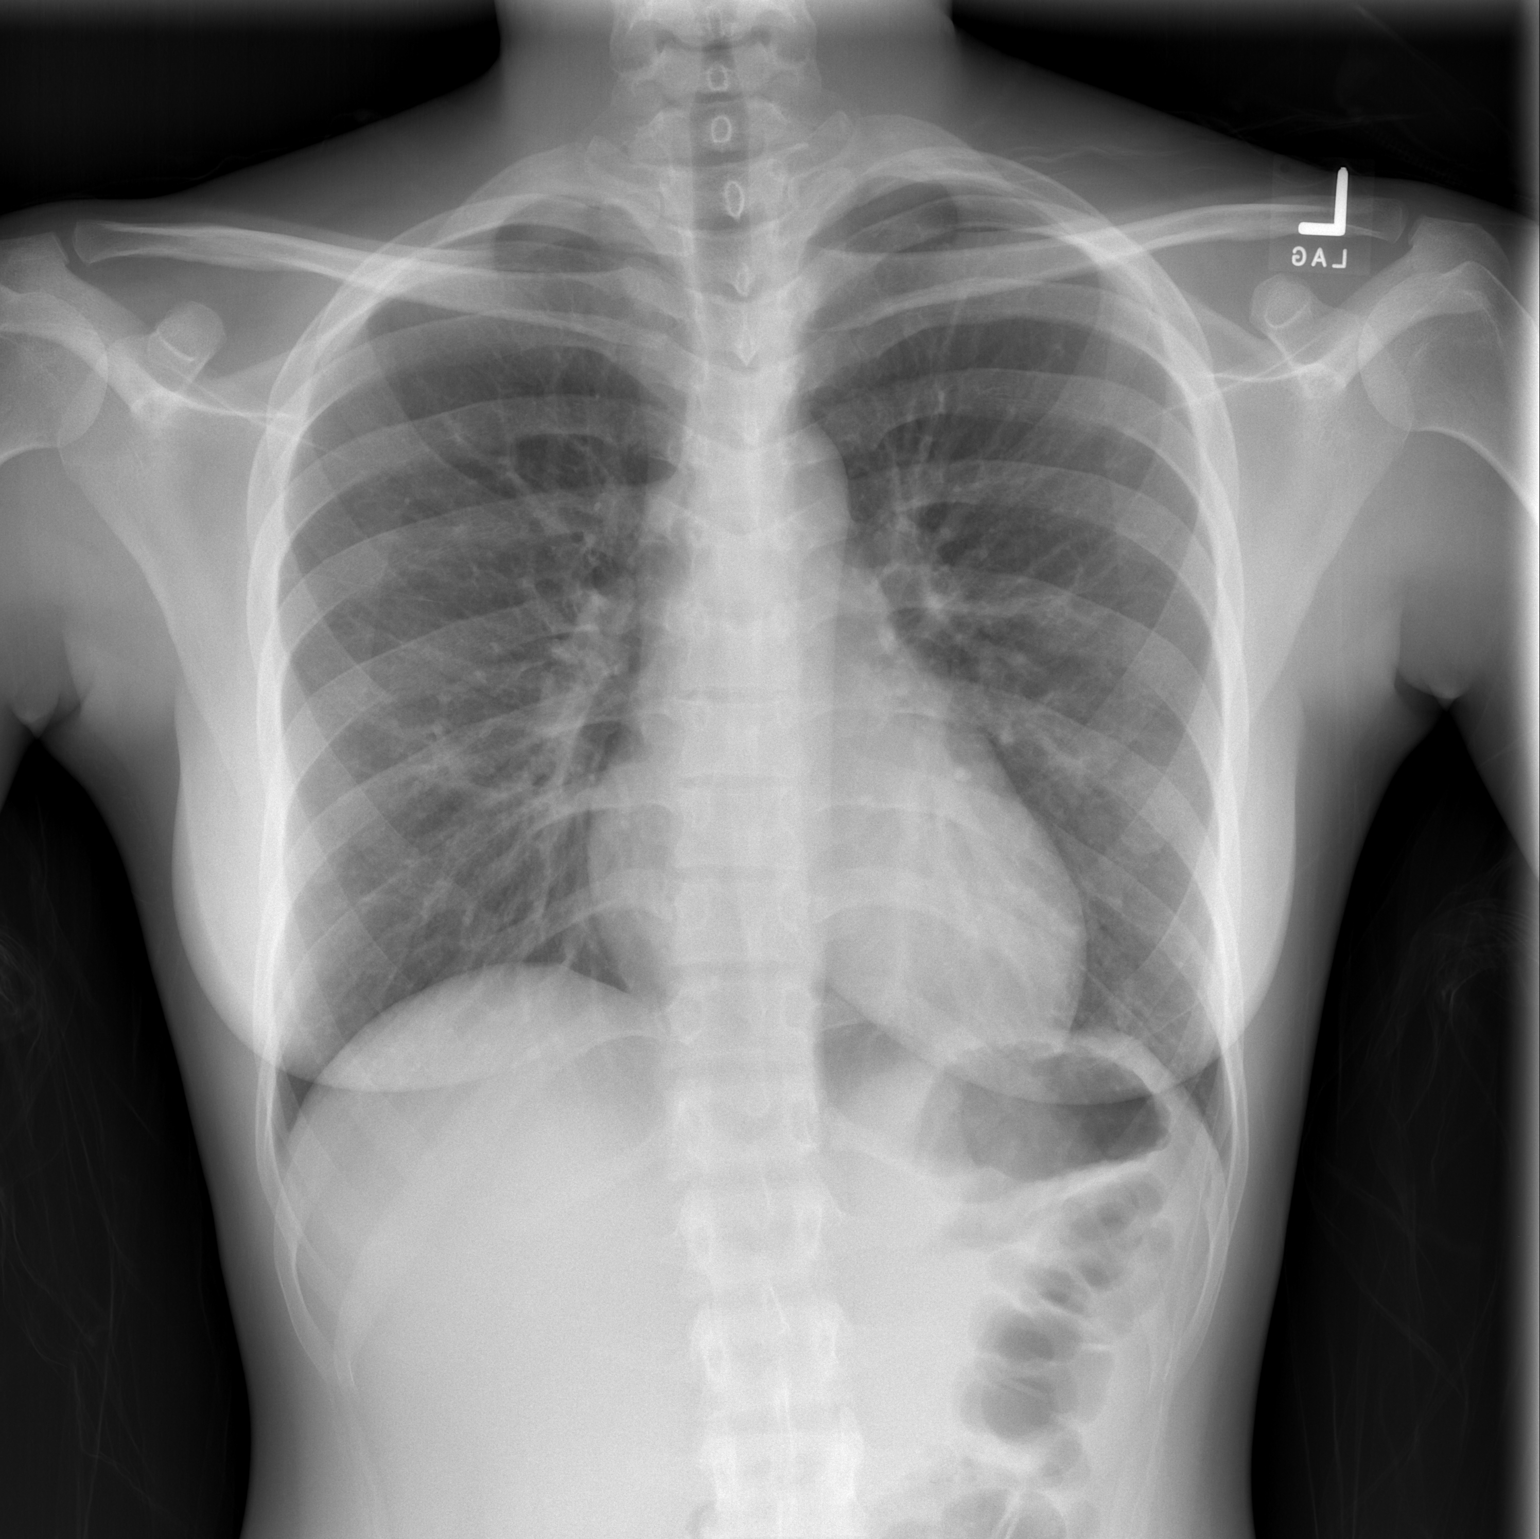

[w chest lat]
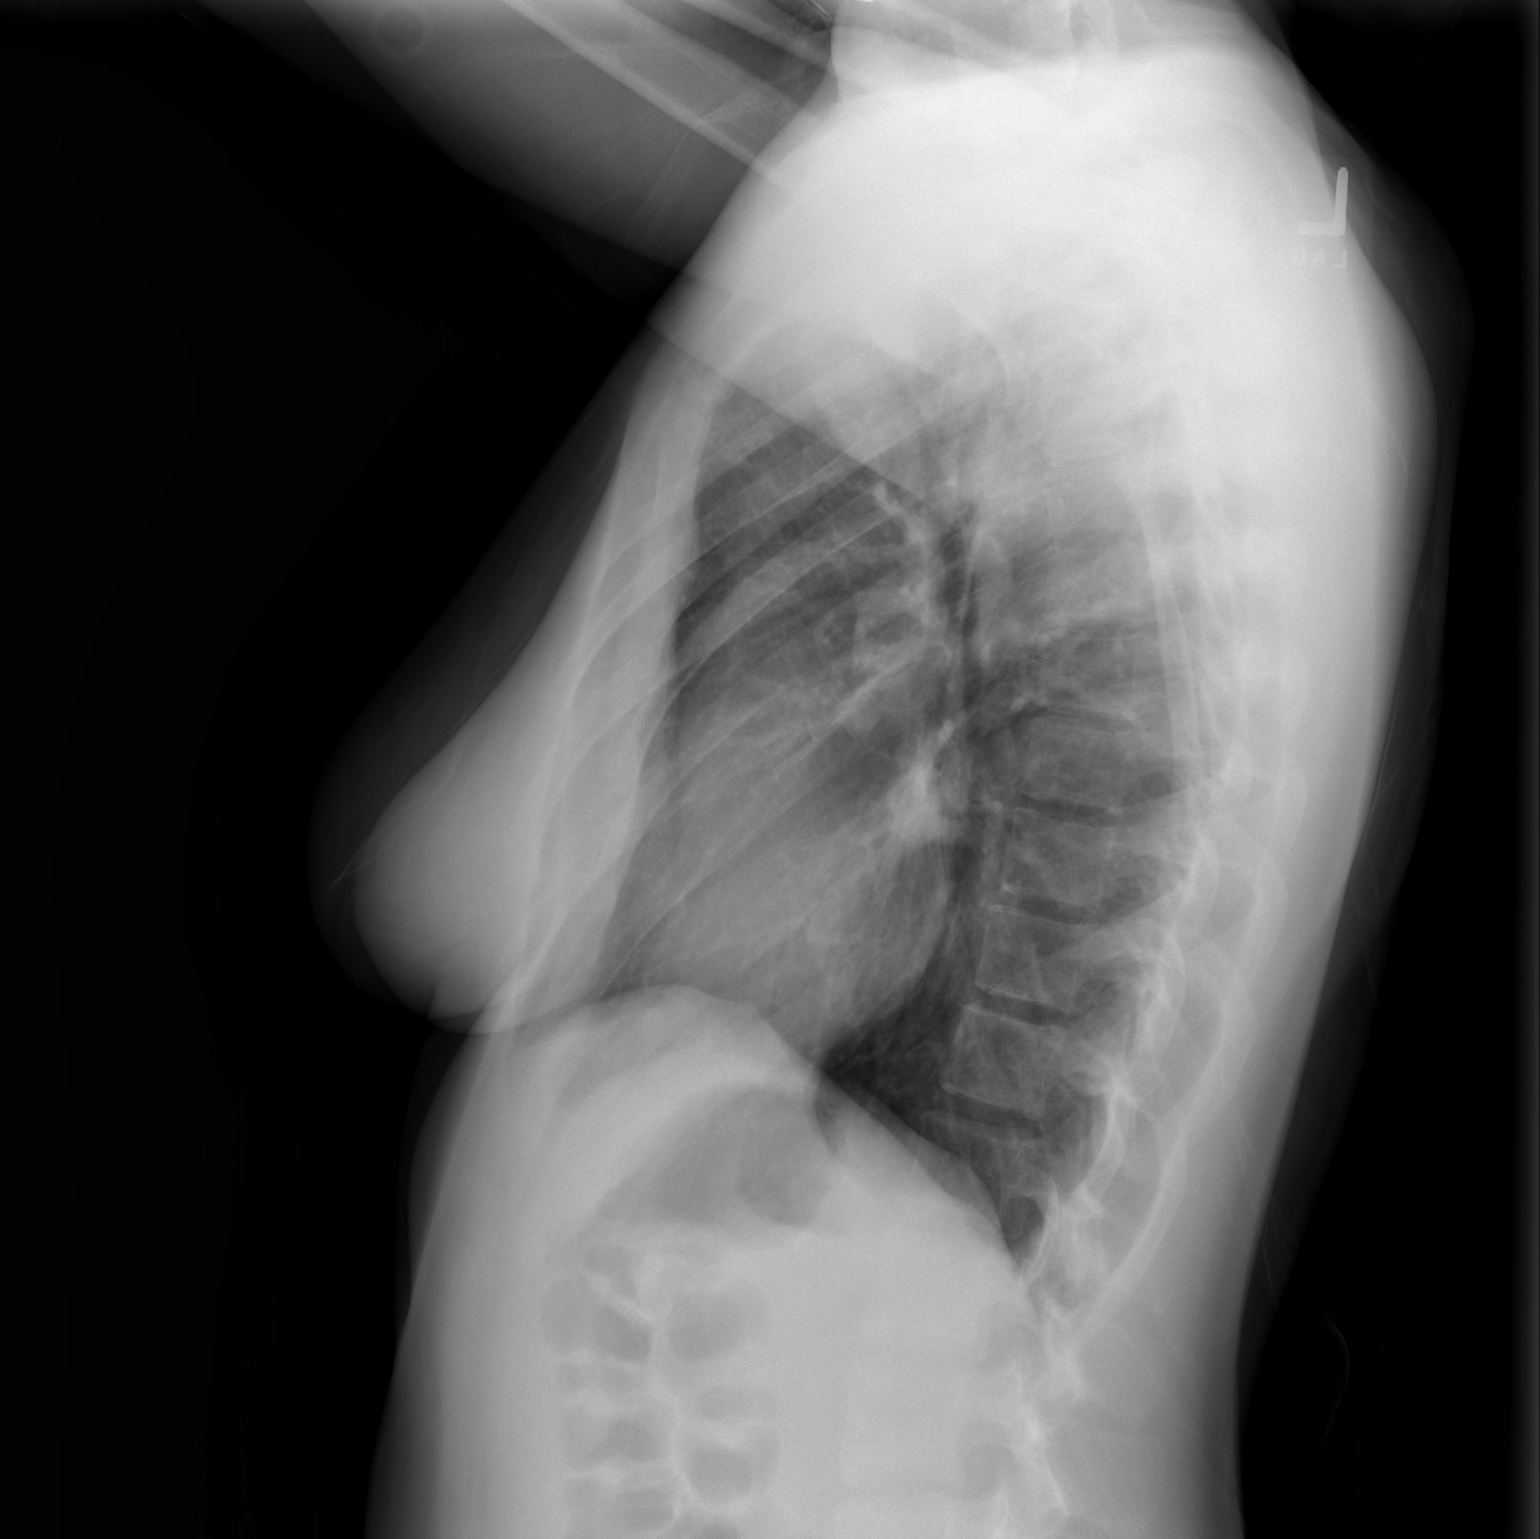

[2 of 2 positions shown; findings below may reference images not displayed]

FINDINGS: The lungs are well-aerated and clear. There is no evidence of focal
opacification, pleural effusion or pneumothorax.

The heart is normal in size; the mediastinal contour is within
normal limits. No acute osseous abnormalities are seen.
IMPRESSION: No acute cardiopulmonary process seen.

## 2020-01-10 IMAGING — US US ABDOMEN COMPLETE
1 series · 14 of 25 positions shown · non-contrast
Comparison: None.

CLINICAL DATA: Abdominal pain for 2 weeks.

EXAM:
ABDOMEN ULTRASOUND COMPLETE

[Series 1: us abdomen complete · 0.19mm/px · 14 of 91 slices shown]
[im 1/91]
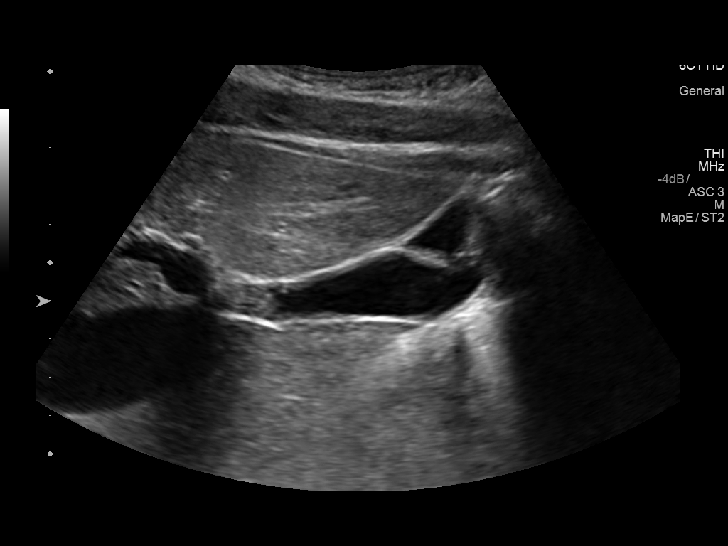
[im 8/91]
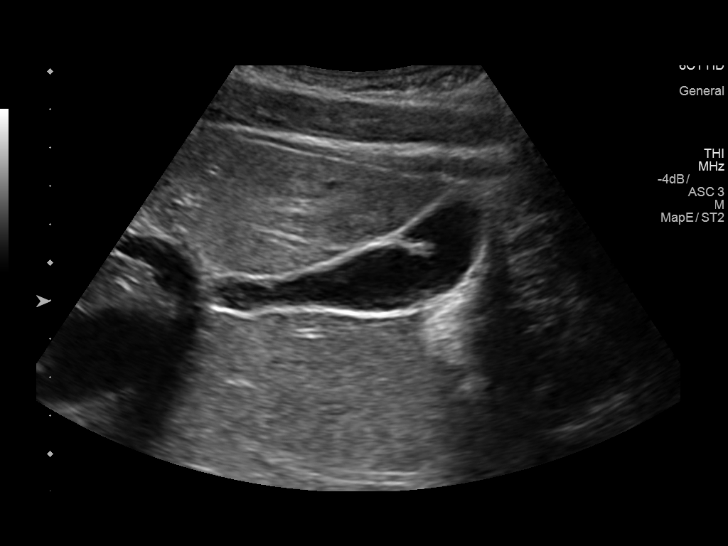
[im 16/91]
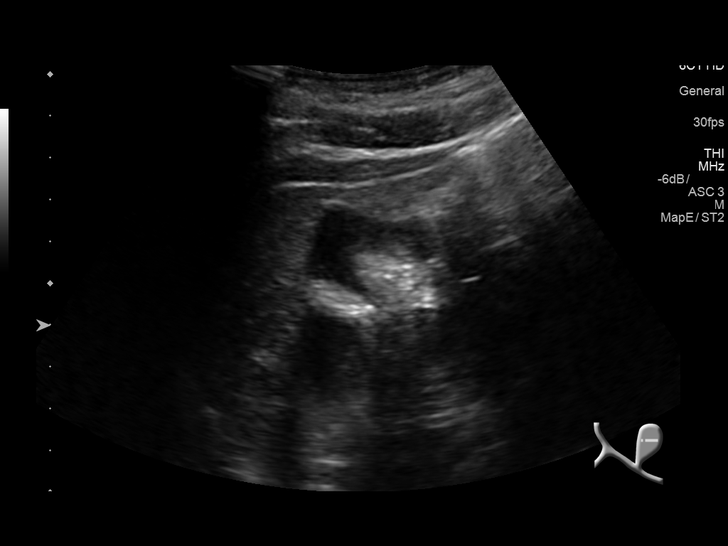
[im 23/91]
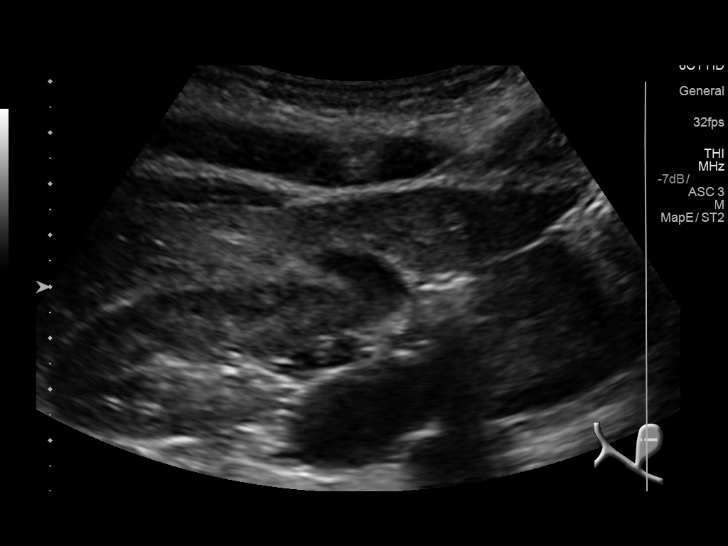
[im 31/91]
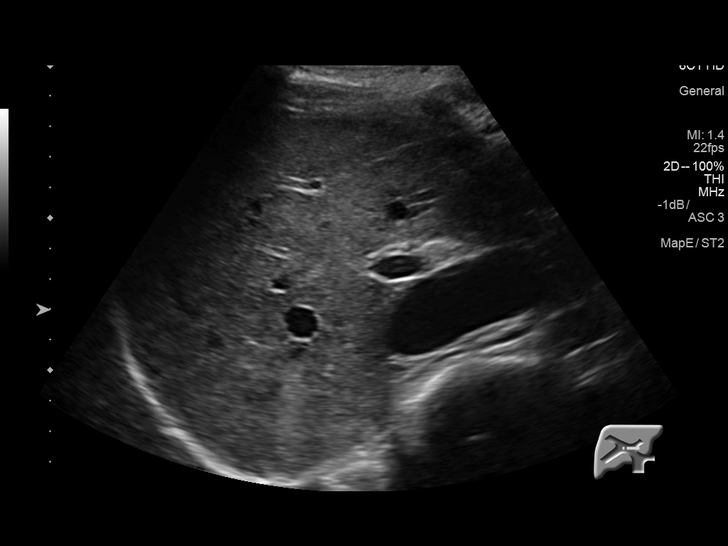
[im 34/91]
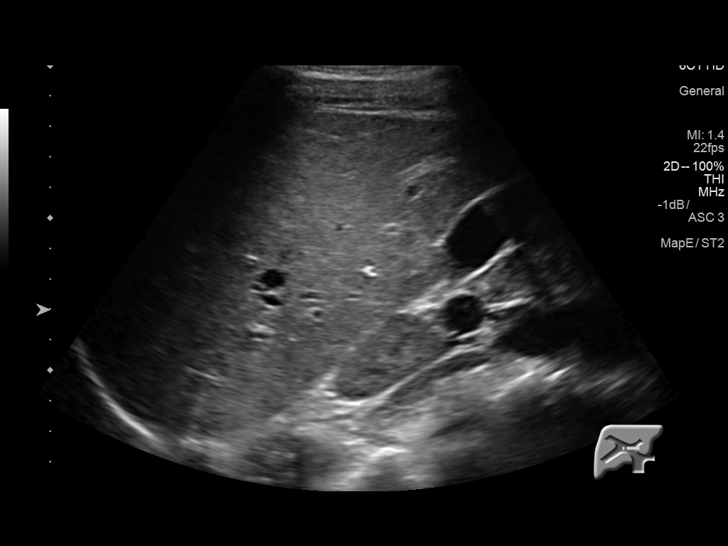
[im 42/91]
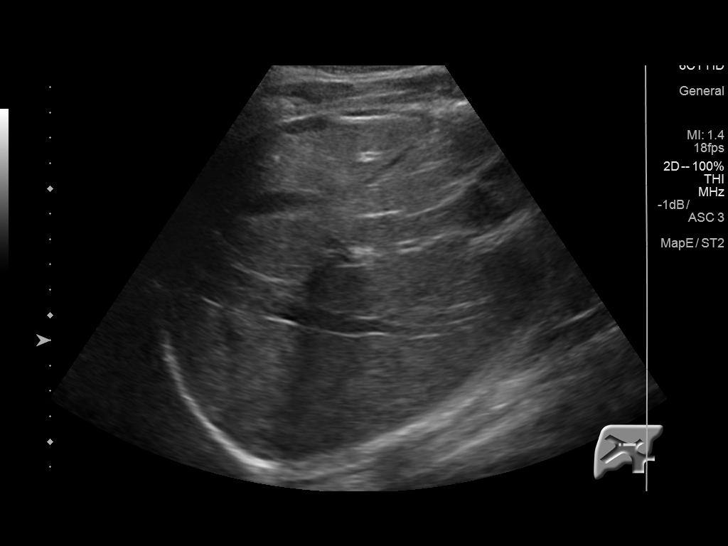
[im 49/91]
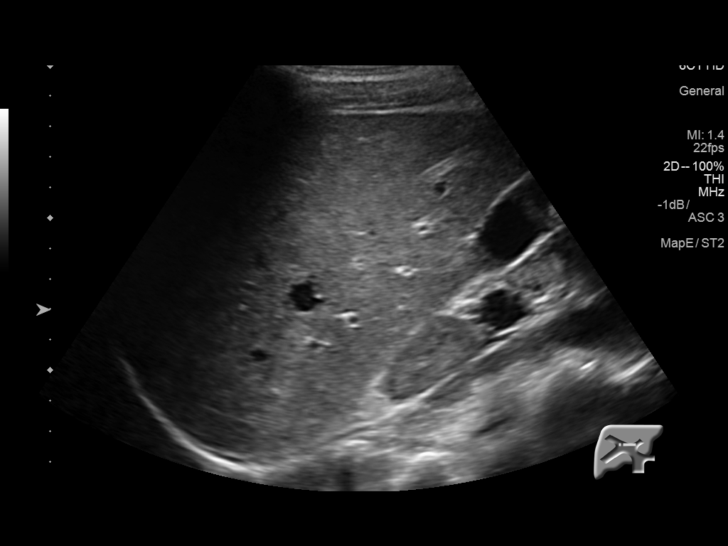
[im 57/91]
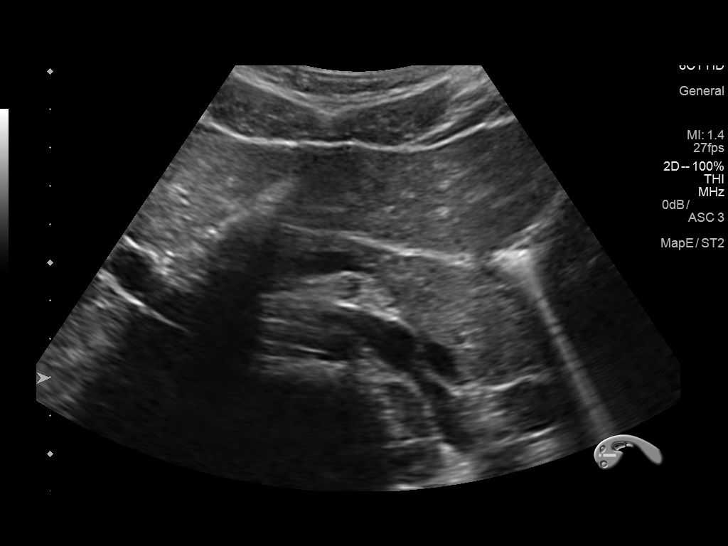
[im 61/91]
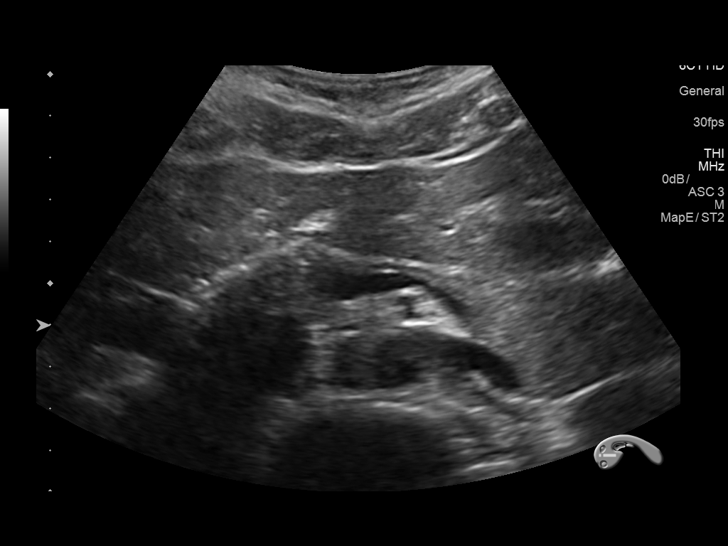
[im 68/91]
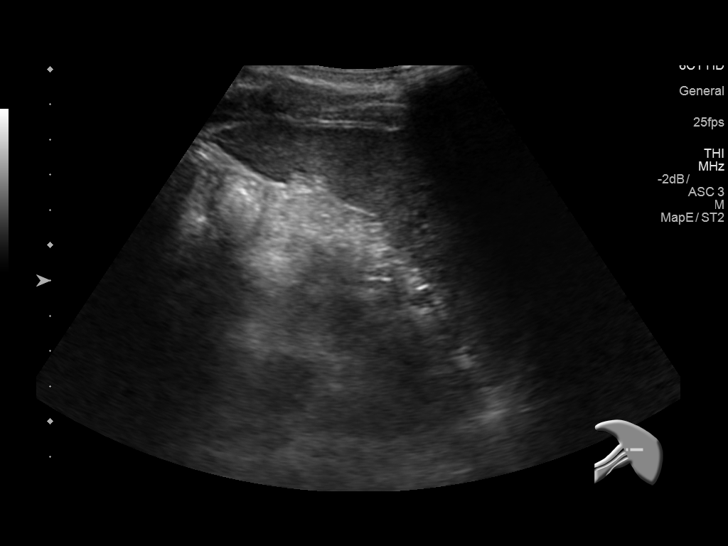
[im 76/91]
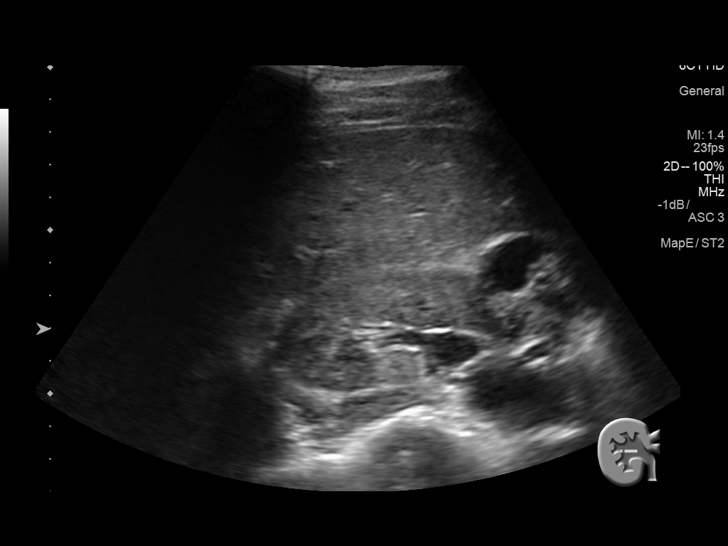
[im 83/91]
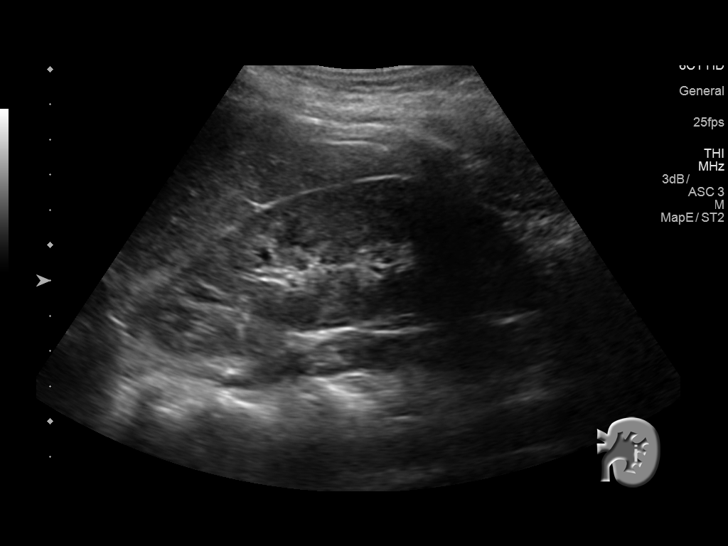
[im 91/91]
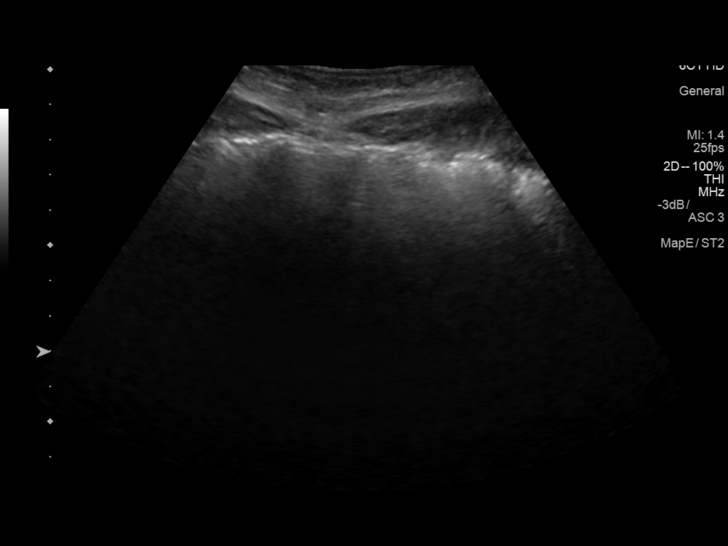

[14 of 25 positions shown; findings below may reference images not displayed]

FINDINGS: Gallbladder: No gallstones or wall thickening visualized. No
sonographic Murphy sign noted by sonographer.

Common bile duct: Diameter: 2.2 mm, normal

Liver: No focal lesion identified. Within normal limits in
parenchymal echogenicity. Portal vein is patent on color Doppler
imaging with normal direction of blood flow towards the liver.

IVC: No abnormality visualized.

Pancreas: Visualized portion unremarkable.

Spleen: Size and appearance within normal limits.

Right Kidney: Length: 9.4 cm. Echogenicity within normal limits. No
mass or hydronephrosis visualized.

Left Kidney: Length: 11 cm. Echogenicity within normal limits. No
mass or hydronephrosis visualized.

Abdominal aorta: No aneurysm visualized.

Other findings: None.
IMPRESSION: Normal examination.  No acute process identified.

## 2020-11-27 ENCOUNTER — Encounter: Payer: Self-pay | Admitting: Neurology

## 2021-02-03 NOTE — Progress Notes (Signed)
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  Migraine prevention:  Will discontinue amitriptyline and increase topiramate to 75mg  at bedtime.  Discussed side effects and advised not to get pregnant due to teratogenic effects.  We can increase to 100mg  at bedtime in 6 weeks if needed 2.  May continue Aleve for rescue as it is effective.  But limit use of pain relievers to no more than 2 days out of week to prevent risk of rebound or medication-overuse headache. 3.  For neck pain, tizanidine 2mg .  Will first take at bedtime.  We can increase to daytime dosing if needed but cautioned for drowsiness. 4.  Limit use of pain relievers to no more than 2 days out of week to prevent risk of rebound or medication-overuse headache. 5.  Keep headache diary 6.  Follow up 6 months.    Subjective:  Charlotte Willis is a 25 year old right-handed female who presents for headache.  History supplemented by referring provider's note.  Started having headaches in early 2020.  It is associated with right-sided neck pain.  It is typically a  right-sided/periorbital (but may be bilateral) pounding that progressively gets more severe during the day.  They last an hour but may occur 2 to 3 times in a day.  They occur almost daily.  Associated photophobia, phonophobia but no nausea, vomiting, visual disturbance, numbness or weakness.  Sometimes wakes her up at night.  Unclear etiology.  Tried increasing water intake and decrease salt with no improvement.  She was taking ibuprofen and ASA daily.  Now taking Aleve once a week  Current NSAIDS/analgesics:  Aleve Current triptans:  none Current ergotamine:  none Current anti-emetic:  none Current muscle relaxants:  none Current  Antihypertensive medications:  none Current Antidepressant medications:  Amitriptyline 25mg  at bedtime, sertraline 100mg  daily Current Anticonvulsant medications:  topiramate 50mg  at bedtime Current anti-CGRP:  none Current Vitamins/Herbal/Supplements:  none Current Antihistamines/Decongestants:  hydroxyzine Other therapy:  none Hormone/birth control:  none  Past NSAIDS/analgesics:  Ibuprofen, ASA Past abortive triptans:  Sumatriptan 100mg  Past abortive ergotamine:  none Past muscle relaxants:  Flexeril Past anti-emetic:  Zofran ODT 8mg  Past antihypertensive medications:  none Past antidepressant medications:  none Past anticonvulsant medications:  none Past anti-CGRP:  none Past vitamins/Herbal/Supplements:  none Past antihistamines/decongestants:  none Other past therapies:  none  Caffeine:  Coffee infrequently.  Mt Dew almost daily Diet:  Needs to increase water intake.  Drinks Dwyane Dee almost daily.  Sometimes skips meals. Exercise:  no Depression:  yes; Anxiety:  Yes.  She reports increased depression and anxiety in 2020. Other pain:  no Sleep hygiene:  Poor - difficult to sometimes fall asleep. Family history of headache:  Both sides of family:  Mother, aunt, grandmother, cousin      PAST MEDICAL HISTORY: Past Medical History:  Diagnosis Date  . GERD (gastroesophageal reflux disease)     PAST SURGICAL HISTORY: No past surgical history on file.  MEDICATIONS: Current Outpatient Medications on File Prior to Visit  Medication Sig Dispense Refill  . benzonatate (TESSALON) 100 MG capsule Take 1 capsule (100 mg total) by mouth every 8 (eight) hours. 21 capsule 0  . cyclobenzaprine (FLEXERIL) 10 MG tablet Take 10 mg by mouth 3 (three) times daily as needed for muscle spasms.    2021  docusate sodium (COLACE) 100 MG capsule Take 1 capsule (100 mg total) daily by mouth. 30 capsule 0  . hydrocortisone (ANUSOL-HC) 25 MG suppository Place 1 suppository (25 mg total) 2 (two) times  daily rectally. 12 suppository 0  . naproxen sodium (ALEVE) 220 MG tablet Take 880 mg daily as needed by mouth (menstrual cramps).    . naproxen sodium (ALEVE) 220 MG tablet Take 220 mg by mouth.    . ondansetron (ZOFRAN-ODT) 8 MG disintegrating tablet Take 1 tablet (8 mg total) by mouth every 8 (eight) hours as needed for nausea. 12 tablet 0  . polyethylene glycol powder (GLYCOLAX/MIRALAX) powder Take 17 g by mouth daily. Until daily soft stools  OTC 119 g 0  . pseudoephedrine-guaifenesin (MUCINEX D) 60-600 MG 12 hr tablet Take 1 tablet by mouth every 12 (twelve) hours. 20 tablet 0  . ranitidine (ZANTAC) 300 MG tablet Take 1 tablet (300 mg total) by mouth at bedtime. 10 tablet 0  . SUMAtriptan (IMITREX) 100 MG tablet Take 1 tablet (100 mg total) by mouth every 2 (two) hours as needed for migraine. Maximum 2 tablets for headache, in 24 hours 10 tablet 0   No current facility-administered medications on file prior to visit.    ALLERGIES: No Known Allergies  FAMILY HISTORY: Family History  Problem Relation Age of Onset  . Hypertension Father     Objective:  Blood pressure 132/79, pulse 81, resp. rate 18, height 5\' 8"  (1.727 m), weight 142 lb (64.4 kg), SpO2 100 %. General: No acute distress.  Patient appears well-groomed.   Head:  Normocephalic/atraumatic Eyes:  fundi examined but not visualized Neck: supple, no paraspinal tenderness, full range of motion Back: No paraspinal tenderness Heart: regular rate and rhythm Lungs: Clear to auscultation bilaterally. Vascular: No carotid bruits. Neurological Exam: Mental status: alert and oriented to person, place, and time, recent and remote memory intact, fund of knowledge intact, attention and concentration intact, speech fluent and not dysarthric, language intact. Cranial nerves: CN I: not tested CN II: pupils equal, round and reactive to light, visual fields intact CN III, IV, VI:  full range of motion, no nystagmus, no ptosis CN V:  facial sensation intact. CN VII: upper and lower face symmetric CN VIII: hearing intact CN IX, X: gag intact, uvula midline CN XI: sternocleidomastoid and trapezius muscles intact CN XII: tongue midline Bulk & Tone: normal, no fasciculations. Motor:  muscle strength 5/5 throughout Sensation:  Pinprick, temperature and vibratory sensation intact. Deep Tendon Reflexes:  2+ throughout,  toes downgoing.   Finger to nose testing:  Without dysmetria.   Heel to shin:  Without dysmetria.   Gait:  Normal station and stride.  Romberg negative.    Thank you for allowing me to take part in the care of this patient.  , DO  CC: Shon Millet, DO

## 2021-02-04 ENCOUNTER — Other Ambulatory Visit: Payer: Self-pay

## 2021-02-04 ENCOUNTER — Telehealth: Payer: Self-pay

## 2021-02-04 ENCOUNTER — Encounter: Payer: Self-pay | Admitting: Neurology

## 2021-02-04 ENCOUNTER — Ambulatory Visit (INDEPENDENT_AMBULATORY_CARE_PROVIDER_SITE_OTHER): Payer: 59 | Admitting: Neurology

## 2021-02-04 VITALS — BP 132/79 | HR 81 | Resp 18 | Ht 68.0 in | Wt 142.0 lb

## 2021-02-04 DIAGNOSIS — G43709 Chronic migraine without aura, not intractable, without status migrainosus: Secondary | ICD-10-CM

## 2021-02-04 DIAGNOSIS — G43809 Other migraine, not intractable, without status migrainosus: Secondary | ICD-10-CM | POA: Diagnosis not present

## 2021-02-04 MED ORDER — TIZANIDINE HCL 2 MG PO TABS: 2.0000 mg | ORAL_TABLET | Freq: Every evening | ORAL | 5 refills | Status: AC | PRN

## 2021-02-04 MED ORDER — TOPIRAMATE 50 MG PO TABS: 75.0000 mg | ORAL_TABLET | Freq: Every day | ORAL | 5 refills | Status: AC

## 2021-02-04 NOTE — Telephone Encounter (Signed)
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 Please advise on referral out.

## 2021-02-04 NOTE — Telephone Encounter (Signed)
We can refer her to [facility]

## 2021-02-04 NOTE — Patient Instructions (Signed)
  1. STOP AMITRIPTYLINE 2. Increase topiramate to 75mg  at bedtime.  If no improvement in 6 weeks, contact me. 3. Take tizanidine 2mg  at bedtime as needed for neck pain 4. Take Aleve for headache but limit use of pain relievers to no more than 2 days out of the week.  These medications include acetaminophen, NSAIDs (ibuprofen/Advil/Motrin, naproxen/Aleve, triptans (Imitrex/sumatriptan), Excedrin, and narcotics.  This will help reduce risk of rebound headaches. 5. Be aware of common food triggers:  - Caffeine:  coffee, black tea, cola, Mt. Dew  - Chocolate  - Dairy:  aged cheeses (brie, blue, cheddar, gouda, [place], provolone, [place], Swiss, etc), chocolate milk, buttermilk, sour cream, limit eggs and yogurt  - Nuts, peanut butter  - Alcohol  - Cereals/grains:  FRESH breads (fresh bagels, sourdough, doughnuts), yeast productions  - Processed/canned/aged/cured meats (pre-packaged deli meats, hotdogs)  - MSG/glutamate:  soy sauce, flavor enhancer, pickled/preserved/marinated foods  - Sweeteners:  aspartame (Equal, Nutrasweet).  Sugar and Splenda are okay  - Vegetables:  legumes (lima beans, lentils, snow peas, fava beans, pinto peans, peas, garbanzo beans), sauerkraut, onions, olives, pickles  - Fruit:  avocados, bananas, citrus fruit (orange, lemon, grapefruit), mango  - Other:  Frozen meals, macaroni and cheese 6. Routine exercise 7. Stay adequately hydrated (aim for 64 oz water daily) 8. Keep headache diary 9. Maintain proper stress management 10. Maintain proper sleep hygiene 11. Do not skip meals 12. Consider supplements:  magnesium citrate 400mg  daily, riboflavin 400mg  daily, coenzyme Q10 100mg  three times daily.

## 2021-02-04 NOTE — Telephone Encounter (Signed)
Close encounter
